# Patient Record
Sex: Male | Born: 1975 | Race: White | Hispanic: No | Marital: Married | Smoking: Heavy tobacco smoker
Health system: Southern US, Community
[De-identification: ages and names within clinical notes are randomized; demographics above are authoritative.]

## PROBLEM LIST (undated history)

## (undated) DIAGNOSIS — G4730 Sleep apnea, unspecified: Secondary | ICD-10-CM

## (undated) DIAGNOSIS — K219 Gastro-esophageal reflux disease without esophagitis: Secondary | ICD-10-CM

## (undated) DIAGNOSIS — T7840XA Allergy, unspecified, initial encounter: Secondary | ICD-10-CM

## (undated) HISTORY — DX: Gastro-esophageal reflux disease without esophagitis: K21.9

## (undated) HISTORY — PX: APPENDECTOMY: SHX54

## (undated) HISTORY — DX: Sleep apnea, unspecified: G47.30

## (undated) HISTORY — DX: Allergy, unspecified, initial encounter: T78.40XA

## (undated) HISTORY — PX: TONSILLECTOMY: SHX5217

---

## 2004-10-05 ENCOUNTER — Ambulatory Visit: Payer: Self-pay | Admitting: Unknown Physician Specialty

## 2015-03-24 ENCOUNTER — Ambulatory Visit
Admission: RE | Admit: 2015-03-24 | Discharge: 2015-03-24 | Disposition: A | Discharge status: Home / Self Care | Payer: No Typology Code available for payment source | Source: Ambulatory Visit | Attending: Family Medicine | Admitting: Family Medicine

## 2015-03-24 ENCOUNTER — Encounter: Payer: Self-pay | Admitting: Family Medicine

## 2015-03-24 ENCOUNTER — Ambulatory Visit (INDEPENDENT_AMBULATORY_CARE_PROVIDER_SITE_OTHER): Payer: PRIVATE HEALTH INSURANCE | Admitting: Family Medicine

## 2015-03-24 VITALS — BP 121/73 | HR 85 | Temp 98.1°F | Resp 19 | Wt 306.5 lb

## 2015-03-24 DIAGNOSIS — M79672 Pain in left foot: Secondary | ICD-10-CM

## 2015-03-24 MED ORDER — NAPROXEN 500 MG PO TABS: 500.0000 mg | ORAL_TABLET | Freq: Two times a day (BID) | ORAL | Status: DC

## 2015-03-24 NOTE — Progress Notes (Signed)
Name: Devin Blackburn   MRN: [id number]    DOB: Jan 07, 1976   Date:03/24/2015       Progress Note  Subjective  Chief Complaint  Chief Complaint  Patient presents with  . Establish Care    NP/ Bad pain in left foot x1 week / depression    HPI   Pt. Present with 1 week history of left foot pain. Pain is present in the back side of heel and sometimes goes up to the leg. Pain is a dull ache but patient describes soreness to touch. Walking makes it worse.He has been taking Ibuprofen which provides moderate relief. No history of trauma to the foot.  Past Medical History  Diagnosis Date  . Allergy   . Anxiety   . Depression   . Sleep apnea     Past Surgical History  Procedure Laterality Date  . Appendectomy      childhood  . Tonsillectomy      childhood    History reviewed. No pertinent family history.  Social History   Social History  . Marital Status: Single    Spouse Name: N/A  . Number of Children: N/A  . Years of Education: N/A   Occupational History  . Not on file.   Social History Main Topics  . Smoking status: Light Tobacco Smoker    Types: Cigarettes  . Smokeless tobacco: Current User    Types: Snuff  . Alcohol Use: 0.0 oz/week    0 Standard drinks or equivalent per week  . Drug Use: No  . Sexual Activity: Yes   Other Topics Concern  . Not on file   Social History Narrative  . No narrative on file     Current outpatient prescriptions:  .  ibuprofen (ADVIL,MOTRIN) 200 MG tablet, Take 250 mg by mouth every 6 (six) hours as needed., Disp: , Rfl:   No Known Allergies   Review of Systems  Musculoskeletal: Positive for joint pain.    Objective  Filed Vitals:   03/24/15 1127  BP: 121/73  Pulse: 85  Temp: 98.1 F (36.7 C)  TempSrc: Oral  Resp: 19  Weight: 306 lb 8 oz (139.027 kg)  SpO2: 96%    Physical Exam  Musculoskeletal:       Left foot: There is tenderness. There is no swelling.       Feet:  Nursing note and vitals  reviewed.   Assessment & Plan  1. Acute pain of left foot Obtain x-rays of foot and started patient on Naprosyn relief of pain and inflammation. Follow-up in  1 week for repeat assessment.if x-rays are unremarkable and his pain persists, he will be referred to podiatry.  - DG Foot Complete Left; Future - naproxen (NAPROSYN) 500 MG tablet; Take 1 tablet (500 mg total) by mouth 2 (two) times daily with a meal.  Dispense: 15 tablet; Refill: 0   Khiara Shuping Asad [facility] 03/24/2015 11:57 AM

## 2015-03-31 ENCOUNTER — Ambulatory Visit: Payer: PRIVATE HEALTH INSURANCE | Admitting: Family Medicine

## 2015-05-17 ENCOUNTER — Ambulatory Visit: Payer: PRIVATE HEALTH INSURANCE | Admitting: Family Medicine

## 2015-05-30 ENCOUNTER — Ambulatory Visit: Payer: PRIVATE HEALTH INSURANCE | Admitting: Family Medicine

## 2015-06-09 ENCOUNTER — Ambulatory Visit (INDEPENDENT_AMBULATORY_CARE_PROVIDER_SITE_OTHER): Payer: 59 | Admitting: Family Medicine

## 2015-06-09 ENCOUNTER — Encounter: Payer: Self-pay | Admitting: Family Medicine

## 2015-06-09 VITALS — BP 120/70 | HR 80 | Temp 97.6°F | Resp 18 | Ht 75.0 in | Wt 299.1 lb

## 2015-06-09 DIAGNOSIS — F419 Anxiety disorder, unspecified: Secondary | ICD-10-CM

## 2015-06-09 DIAGNOSIS — K219 Gastro-esophageal reflux disease without esophagitis: Secondary | ICD-10-CM | POA: Diagnosis not present

## 2015-06-09 DIAGNOSIS — IMO0001 Reserved for inherently not codable concepts without codable children: Secondary | ICD-10-CM

## 2015-06-09 DIAGNOSIS — E669 Obesity, unspecified: Secondary | ICD-10-CM | POA: Insufficient documentation

## 2015-06-09 MED ORDER — ALPRAZOLAM 0.25 MG PO TABS: 0.2500 mg | ORAL_TABLET | Freq: Three times a day (TID) | ORAL | Status: DC | PRN
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
## 2015-06-10 LAB — COMPREHENSIVE METABOLIC PANEL
A/G RATIO: 2.5 (ref 1.1–2.5)
ALT: 81 IU/L — AB (ref 0–44)
AST: 66 IU/L — ABNORMAL HIGH (ref 0–40)
Albumin: 4.7 g/dL (ref 3.5–5.5)
Alkaline Phosphatase: 71 IU/L (ref 39–117)
BILIRUBIN TOTAL: 0.6 mg/dL (ref 0.0–1.2)
BUN / CREAT RATIO: 16 (ref 8–19)
BUN: 17 mg/dL (ref 6–20)
CHLORIDE: 98 mmol/L (ref 97–106)
CO2: 26 mmol/L (ref 18–29)
Calcium: 9.9 mg/dL (ref 8.7–10.2)
Creatinine, Ser: 1.09 mg/dL (ref 0.76–1.27)
GFR calc non Af Amer: 86 mL/min/{1.73_m2} (ref >59)
GFR, EST AFRICAN AMERICAN: 99 mL/min/{1.73_m2} (ref >59)
GLOBULIN, TOTAL: 1.9 g/dL (ref 1.5–4.5)
Glucose: 99 mg/dL (ref 65–99)
POTASSIUM: 5 mmol/L (ref 3.5–5.2)
SODIUM: 139 mmol/L (ref 136–144)
TOTAL PROTEIN: 6.6 g/dL (ref 6.0–8.5)

## 2015-06-10 LAB — TSH: TSH: 2.18 u[IU]/mL (ref 0.450–4.500)

## 2015-06-10 LAB — HEMOGLOBIN A1C
ESTIMATED AVERAGE GLUCOSE: 108 mg/dL
Hgb A1c MFr Bld: 5.4 % (ref 4.8–5.6)

## 2015-06-20 ENCOUNTER — Encounter: Payer: Self-pay | Admitting: Family Medicine

## 2015-06-20 ENCOUNTER — Ambulatory Visit (INDEPENDENT_AMBULATORY_CARE_PROVIDER_SITE_OTHER): Payer: 59 | Admitting: Family Medicine

## 2015-06-20 VITALS — BP 120/80 | HR 80 | Temp 98.3°F | Resp 16 | Ht 75.0 in | Wt 299.0 lb

## 2015-06-20 DIAGNOSIS — R740 Nonspecific elevation of levels of transaminase and lactic acid dehydrogenase [LDH]: Secondary | ICD-10-CM

## 2015-06-20 DIAGNOSIS — R7401 Elevation of levels of liver transaminase levels: Secondary | ICD-10-CM

## 2015-06-20 DIAGNOSIS — IMO0002 Reserved for concepts with insufficient information to code with codable children: Secondary | ICD-10-CM

## 2015-06-20 DIAGNOSIS — F1099 Alcohol use, unspecified with unspecified alcohol-induced disorder: Secondary | ICD-10-CM

## 2015-06-20 DIAGNOSIS — F419 Anxiety disorder, unspecified: Secondary | ICD-10-CM | POA: Diagnosis not present

## 2015-06-20 MED ORDER — ALPRAZOLAM 0.5 MG PO TABS: 0.5000 mg | ORAL_TABLET | Freq: Three times a day (TID) | ORAL | Status: DC | PRN

## 2015-06-20 NOTE — Progress Notes (Signed)
Name: Devin Blackburn   MRN: [id number]    DOB: 10/23/1975   Date:06/20/2015       Progress Note  Subjective  Chief Complaint  Chief Complaint  Patient presents with  . Follow-up    elevated liver enzymes  . Anxiety  . Gastroesophageal Reflux    Anxiety Presents for follow-up visit. Symptoms include excessive worry, insomnia, malaise and nervous/anxious behavior. Patient reports no depressed mood or nausea. The severity of symptoms is moderate and causing significant distress (Pt. drinks alcohol to cope with anxiety and depression.). The symptoms are aggravated by family issues.   Risk factors include a major life event. The treatment provided mild relief. Compliance with prior treatments has been good.    Pt. Is here for follow up of elevated liver enzymes. AST and ALT were obtained 10-days ago and were 66 and 81 respectively. Pt. Has no known history of liver disease. He does admit to drinking heavily (3 18 oz cans every weeknight) and similarly on the weekends.  He has since decreased his consumption marginally (last Sunday, he 'might have' had 12  12 oz cans of beer from 1 PM to 9PM).   Past Medical History  Diagnosis Date  . Allergy   . Sleep apnea   . Acid reflux     Sometimes takes Omeprazole.    Past Surgical History  Procedure Laterality Date  . Appendectomy      childhood  . Tonsillectomy      childhood    Family History  Problem Relation Age of Onset  . Depression Mother   . Thyroid disease Mother   . Diabetes Mother     Social History   Social History  . Marital Status: Single    Spouse Name: N/A  . Number of Children: N/A  . Years of Education: N/A   Occupational History  . Not on file.   Social History Main Topics  . Smoking status: Light Tobacco Smoker    Types: Cigarettes  . Smokeless tobacco: Current User    Types: Chew  . Alcohol Use: 0.0 oz/week    0 Standard drinks or equivalent per week  . Drug Use: No  . Sexual Activity: Yes    Other Topics Concern  . Not on file   Social History Narrative    Current outpatient prescriptions:  .  ALPRAZolam (XANAX) 0.25 MG tablet, Take 1 tablet (0.25 mg total) by mouth 3 (three) times daily as needed for anxiety., Disp: 90 tablet, Rfl: 0 .  ibuprofen (ADVIL,MOTRIN) 200 MG tablet, Take 250 mg by mouth every 6 (six) hours as needed., Disp: , Rfl:  .  naproxen (NAPROSYN) 500 MG tablet, Take 1 tablet (500 mg total) by mouth 2 (two) times daily with a meal. (Patient not taking: Reported on 06/09/2015), Disp: 15 tablet, Rfl: 0  No Known Allergies   Review of Systems  Gastrointestinal: Negative for heartburn, nausea, vomiting, abdominal pain, diarrhea, blood in stool and melena.  Genitourinary: Negative for urgency, frequency and hematuria.  Psychiatric/Behavioral: Positive for depression. The patient is nervous/anxious and has insomnia.     Objective  Filed Vitals:   06/20/15 0911  BP: 120/80  Pulse: 80  Temp: 98.3 F (36.8 C)  TempSrc: Oral  Resp: 16  Height: 6\' 3"  (1.905 m)  Weight: 299 lb (135.626 kg)  SpO2: 96%    Physical Exam  Constitutional: He is oriented to person, place, and time and well-developed, well-nourished, and in no distress.  HENT:  Head:  Normocephalic and atraumatic.  Cardiovascular: Normal rate, regular rhythm and normal heart sounds.   No murmur heard. Pulmonary/Chest: Effort normal and breath sounds normal. He has no wheezes.  Abdominal: Soft. Normal appearance and bowel sounds are normal. There is no hepatomegaly. There is no tenderness. There is no rebound.  Neurological: He is alert and oriented to person, place, and time.  Psychiatric: Memory, affect and judgment normal. His mood appears anxious.  Emotional while describing his alcohol use.  Nursing note and vitals reviewed.  Recent Results (from the past 2160 hour(s))  Comprehensive Metabolic Panel (CMET)     Status: Abnormal   Collection Time: 06/09/15 11:12 AM  Result Value Ref  Range   Glucose 99 65 - 99 mg/dL   BUN 17 6 - 20 mg/dL   Creatinine, Ser 5.28 0.76 - 1.27 mg/dL   GFR calc non Af Amer 86 >59 mL/min/1.73   GFR calc Af Amer 99 >59 mL/min/1.73   BUN/Creatinine Ratio 16 8 - 19   Sodium 139 136 - 144 mmol/L   Potassium 5.0 3.5 - 5.2 mmol/L   Chloride 98 97 - 106 mmol/L   CO2 26 18 - 29 mmol/L   Calcium 9.9 8.7 - 10.2 mg/dL   Total Protein 6.6 6.0 - 8.5 g/dL   Albumin 4.7 3.5 - 5.5 g/dL   Globulin, Total 1.9 1.5 - 4.5 g/dL   Albumin/Globulin Ratio 2.5 1.1 - 2.5   Bilirubin Total 0.6 0.0 - 1.2 mg/dL   Alkaline Phosphatase 71 39 - 117 IU/L   AST 66 (H) 0 - 40 IU/L   ALT 81 (H) 0 - 44 IU/L  TSH     Status: None   Collection Time: 06/09/15 11:12 AM  Result Value Ref Range   TSH 2.180 0.450 - 4.500 uIU/mL  HgB A1c     Status: None   Collection Time: 06/09/15 11:12 AM  Result Value Ref Range   Hgb A1c MFr Bld 5.4 4.8 - 5.6 %    Comment:          Pre-diabetes: 5.7 - 6.4          Diabetes: >6.4          Glycemic control for adults with diabetes: <7.0    Est. average glucose Bld gHb Est-mCnc 108 mg/dL     Assessment & Plan  1. Anxiety Patient experienced only marginal improvement with alprazolam 0.25 mg, we'll change to 0.5 mg 3 times a day when necessary for relief of anxiety. Again educated on the dependence potential. Patient may need evaluation by psychiatry for symptoms suggestive of PTSD but is not interested in any further pharmacotherapy at this time. Refills provided and follow-up in one month. - ALPRAZolam (XANAX) 0.5 MG tablet; Take 1 tablet (0.5 mg total) by mouth 3 (three) times daily as needed for anxiety.  Dispense: 90 tablet; Refill: 0  2. Transaminitis Likely 2/2 alcohol abuse but we'll obtain lab work to rule out hepatitis and other etiologies. - Basic Metabolic Panel (BMET) - Hepatic function panel - Gamma GT - US Abdomen Limited RUQ; Future - CBC w/Diff/Platelet - Hepatitis, Acute  3. Alcohol use disorder (HCC) Discussed in  detail regarding patient's alcohol use. Recommended to seek counseling with AA but patient has declined at this time. Educated guarding the dangers of alcohol use. Has reduced consumption somewhat. Follow-up at his next office visit appointment.   Devin Blackburn Asad [facility] 06/20/2015 9:21 AM

## 2015-06-21 LAB — CBC WITH DIFFERENTIAL/PLATELET
BASOS ABS: 0 10*3/uL (ref 0.0–0.2)
Basos: 0 %
EOS (ABSOLUTE): 0 10*3/uL (ref 0.0–0.4)
Eos: 1 %
Hematocrit: 43.5 % (ref 37.5–51.0)
Hemoglobin: 14.9 g/dL (ref 12.6–17.7)
Immature Grans (Abs): 0 10*3/uL (ref 0.0–0.1)
Immature Granulocytes: 0 %
LYMPHS ABS: 1.3 10*3/uL (ref 0.7–3.1)
Lymphs: 23 %
MCH: 30.2 pg (ref 26.6–33.0)
MCHC: 34.3 g/dL (ref 31.5–35.7)
MCV: 88 fL (ref 79–97)
MONOCYTES: 10 %
MONOS ABS: 0.6 10*3/uL (ref 0.1–0.9)
Neutrophils Absolute: 3.8 10*3/uL (ref 1.4–7.0)
Neutrophils: 66 %
Platelets: 249 10*3/uL (ref 150–379)
RBC: 4.94 x10E6/uL (ref 4.14–5.80)
RDW: 12.7 % (ref 12.3–15.4)
WBC: 5.7 10*3/uL (ref 3.4–10.8)

## 2015-06-21 LAB — BASIC METABOLIC PANEL
BUN / CREAT RATIO: 13 (ref 8–19)
BUN: 13 mg/dL (ref 6–20)
CHLORIDE: 100 mmol/L (ref 97–106)
CO2: 25 mmol/L (ref 18–29)
Calcium: 9.8 mg/dL (ref 8.7–10.2)
Creatinine, Ser: 1.02 mg/dL (ref 0.76–1.27)
GFR, EST AFRICAN AMERICAN: 107 mL/min/{1.73_m2} (ref >59)
GFR, EST NON AFRICAN AMERICAN: 93 mL/min/{1.73_m2} (ref >59)
Glucose: 100 mg/dL — ABNORMAL HIGH (ref 65–99)
Potassium: 4.7 mmol/L (ref 3.5–5.2)
Sodium: 142 mmol/L (ref 136–144)

## 2015-06-21 LAB — HEPATIC FUNCTION PANEL
ALK PHOS: 59 IU/L (ref 39–117)
ALT: 36 IU/L (ref 0–44)
AST: 25 IU/L (ref 0–40)
Albumin: 4.3 g/dL (ref 3.5–5.5)
BILIRUBIN, DIRECT: 0.21 mg/dL (ref 0.00–0.40)
Bilirubin Total: 0.8 mg/dL (ref 0.0–1.2)
Total Protein: 6.6 g/dL (ref 6.0–8.5)

## 2015-06-21 LAB — HEPATITIS PANEL, ACUTE
HEP A IGM: NEGATIVE
HEP B S AG: NEGATIVE
Hep B C IgM: NEGATIVE
Hep C Virus Ab: <0.1 s/co ratio (ref 0.0–0.9)

## 2015-06-21 LAB — GAMMA GT: GGT: 17 IU/L (ref 0–65)

## 2015-06-23 ENCOUNTER — Ambulatory Visit: Payer: 59 | Admitting: Family Medicine

## 2015-06-26 ENCOUNTER — Ambulatory Visit: Payer: No Typology Code available for payment source

## 2015-06-30 ENCOUNTER — Ambulatory Visit: Payer: 59

## 2015-07-14 ENCOUNTER — Ambulatory Visit: Payer: 59 | Admitting: Family Medicine

## 2015-07-22 ENCOUNTER — Other Ambulatory Visit: Payer: Self-pay | Admitting: Family Medicine

## 2015-10-06 ENCOUNTER — Ambulatory Visit (INDEPENDENT_AMBULATORY_CARE_PROVIDER_SITE_OTHER): Payer: 59 | Admitting: Family Medicine

## 2015-10-06 ENCOUNTER — Encounter: Payer: Self-pay | Admitting: Family Medicine

## 2015-10-06 VITALS — BP 118/76 | HR 74 | Temp 97.5°F | Resp 17 | Ht 75.0 in | Wt 304.3 lb

## 2015-10-06 DIAGNOSIS — F419 Anxiety disorder, unspecified: Secondary | ICD-10-CM

## 2015-10-06 DIAGNOSIS — F4310 Post-traumatic stress disorder, unspecified: Secondary | ICD-10-CM | POA: Diagnosis not present

## 2015-10-06 MED ORDER — ALPRAZOLAM 0.5 MG PO TABS: 0.5000 mg | ORAL_TABLET | Freq: Three times a day (TID) | ORAL | Status: DC | PRN

## 2015-10-06 MED ORDER — SERTRALINE HCL 50 MG PO TABS: 50.0000 mg | ORAL_TABLET | Freq: Every day | ORAL | Status: DC

## 2015-10-06 MED ORDER — SERTRALINE HCL 25 MG PO TABS: 25.0000 mg | ORAL_TABLET | Freq: Every day | ORAL | Status: DC
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
## 2015-11-03 ENCOUNTER — Other Ambulatory Visit: Payer: Self-pay | Admitting: Family Medicine

## 2015-11-17 ENCOUNTER — Ambulatory Visit: Payer: 59 | Admitting: Family Medicine

## 2015-11-24 ENCOUNTER — Ambulatory Visit: Payer: 59 | Admitting: Family Medicine

## 2015-12-01 ENCOUNTER — Encounter: Payer: Self-pay | Admitting: Family Medicine

## 2015-12-01 ENCOUNTER — Ambulatory Visit (INDEPENDENT_AMBULATORY_CARE_PROVIDER_SITE_OTHER): Payer: 59 | Admitting: Family Medicine

## 2015-12-01 ENCOUNTER — Ambulatory Visit: Payer: 59 | Admitting: Family Medicine

## 2015-12-01 VITALS — BP 122/78 | HR 82 | Temp 98.7°F | Resp 17 | Ht 75.0 in | Wt 286.4 lb

## 2015-12-01 DIAGNOSIS — F419 Anxiety disorder, unspecified: Secondary | ICD-10-CM | POA: Diagnosis not present

## 2015-12-01 DIAGNOSIS — M25571 Pain in right ankle and joints of right foot: Secondary | ICD-10-CM

## 2015-12-01 DIAGNOSIS — F4310 Post-traumatic stress disorder, unspecified: Secondary | ICD-10-CM | POA: Diagnosis not present

## 2015-12-01 DIAGNOSIS — F988 Other specified behavioral and emotional disorders with onset usually occurring in childhood and adolescence: Secondary | ICD-10-CM | POA: Insufficient documentation

## 2015-12-01 DIAGNOSIS — Z1389 Encounter for screening for other disorder: Secondary | ICD-10-CM

## 2015-12-01 DIAGNOSIS — Z1339 Encounter for screening examination for other mental health and behavioral disorders: Secondary | ICD-10-CM

## 2015-12-01 MED ORDER — ALPRAZOLAM 0.5 MG PO TABS: 0.5000 mg | ORAL_TABLET | Freq: Three times a day (TID) | ORAL | Status: DC | PRN

## 2015-12-01 MED ORDER — SERTRALINE HCL 50 MG PO TABS: 50.0000 mg | ORAL_TABLET | Freq: Every day | ORAL | Status: DC

## 2015-12-01 NOTE — Progress Notes (Signed)
Name: Devin Blackburn   MRN: [id number]    DOB: [birth year]   Date:12/01/2015       Progress Note  Subjective  Chief Complaint  Chief Complaint  Patient presents with  . Follow-up    6 wk  . Medication Refill    xanax / sertraline     Ankle Pain  Injury mechanism: History of right ankle fracture many years ago, no recent  trauma. The pain is present in the right ankle. The quality of the pain is described as aching. The pain is at a severity of 5/10. The pain is moderate. The pain has been constant (since he has been rolling his ankle, has constant pain.) since onset. Pertinent negatives include no inability to bear weight or loss of motion. The symptoms are aggravated by movement and weight bearing (rolling over makes it worse). He has tried ice, NSAIDs and rest for the symptoms.  Anxiety Presents for follow-up visit. The problem has been gradually improving. Symptoms include excessive worry and nervous/anxious behavior. Patient reports no depressed mood, insomnia or panic. Symptoms occur most days. The severity of symptoms is causing significant distress and moderate.   His past medical history is significant for anxiety/panic attacks. Past treatments include benzodiazephines and SSRIs. The treatment provided moderate relief. Compliance with prior treatments has been good.   Pt. Is convinced that he has Attention Deficit Disorder, he has trouble focusing (as in focusing during a conversation, not being able to finish reading a book). He has never been formally diagnosed or tested as an adult but believes that had Attention Deficit Hyperactivity Disorder as a child as well which was not treated.   Past Medical History  Diagnosis Date  . Allergy   . Sleep apnea   . Acid reflux     Sometimes takes Omeprazole.    Past Surgical History  Procedure Laterality Date  . Appendectomy      childhood  . Tonsillectomy      childhood    Family History  Problem Relation Age of Onset  .  Depression Mother   . Thyroid disease Mother   . Diabetes Mother     Social History   Social History  . Marital Status: Single    Spouse Name: N/A  . Number of Children: N/A  . Years of Education: N/A   Occupational History  . Not on file.   Social History Main Topics  . Smoking status: Light Tobacco Smoker    Types: Cigarettes  . Smokeless tobacco: Current User    Types: Chew  . Alcohol Use: 0.0 oz/week    0 Standard drinks or equivalent per week  . Drug Use: No  . Sexual Activity: Yes   Other Topics Concern  . Not on file   Social History Narrative     Current outpatient prescriptions:  .  ALPRAZolam (XANAX) 0.5 MG tablet, Take 1 tablet (0.5 mg total) by mouth 3 (three) times daily as needed for anxiety., Disp: 90 tablet, Rfl: 0 .  ibuprofen (ADVIL,MOTRIN) 200 MG tablet, Take 250 mg by mouth every 6 (six) hours as needed. Reported on 10/06/2015, Disp: , Rfl:  .  sertraline (ZOLOFT) 50 MG tablet, TAKE 1 TABLET EVERY DAY, Disp: 90 tablet, Rfl: 0  No Known Allergies   Review of Systems  Constitutional: Negative for fever and chills.  Musculoskeletal: Positive for joint pain.  Psychiatric/Behavioral: Positive for depression (post-traumatic stress reaction.). The patient is nervous/anxious. The patient does not have insomnia.  Objective  Filed Vitals:   12/01/15 0932  BP: 122/78  Pulse: 82  Temp: 98.7 F (37.1 C)  TempSrc: Oral  Resp: 17  Height: 6\' 3"  (1.905 m)  Weight: 286 lb 6.4 oz (129.91 kg)  SpO2: 96%    Physical Exam  Constitutional: He is well-developed, well-nourished, and in no distress.  Musculoskeletal:       Right ankle: He exhibits no swelling. Tenderness. Medial malleolus (around the inferior margin of medial malleolus.) tenderness found.       Feet:  Nursing note and vitals reviewed.     Assessment & Plan  1. Pain in right ankle Persistent activity limiting pain in right ankle, referral to orthopedic surgery. - Ambulatory  referral to Orthopedic Surgery  2. Anxiety Stable and responsive to alprazolam taken 3 times daily as needed. Refills provided. - ALPRAZolam (XANAX) 0.5 MG tablet; Take 1 tablet (0.5 mg total) by mouth 3 (three) times daily as needed for anxiety.  Dispense: 90 tablet; Refill: 0  3. Post-traumatic stress reaction Continue on sertraline to relieve symptoms of posttraumatic stress reaction - sertraline (ZOLOFT) 50 MG tablet; Take 1 tablet (50 mg total) by mouth daily.  Dispense: 90 tablet; Refill: 0  4. Attention deficit hyperactivity disorder evaluation Adult ADHD questionnaire provided to patient, which will be reviewed at his follow-up visit in one month.   Marchia Diguglielmo Asad A. [facility] 12/01/2015 9:55 AM

## 2015-12-29 ENCOUNTER — Encounter: Payer: Self-pay | Admitting: Family Medicine

## 2015-12-29 ENCOUNTER — Ambulatory Visit (INDEPENDENT_AMBULATORY_CARE_PROVIDER_SITE_OTHER): Payer: 59 | Admitting: Family Medicine

## 2015-12-29 VITALS — BP 120/72 | HR 73 | Temp 98.3°F | Resp 16 | Ht 75.0 in | Wt 290.8 lb

## 2015-12-29 DIAGNOSIS — F4310 Post-traumatic stress disorder, unspecified: Secondary | ICD-10-CM

## 2015-12-29 DIAGNOSIS — F419 Anxiety disorder, unspecified: Secondary | ICD-10-CM | POA: Diagnosis not present

## 2015-12-29 DIAGNOSIS — F900 Attention-deficit hyperactivity disorder, predominantly inattentive type: Secondary | ICD-10-CM

## 2015-12-29 DIAGNOSIS — F988 Other specified behavioral and emotional disorders with onset usually occurring in childhood and adolescence: Secondary | ICD-10-CM

## 2015-12-29 MED ORDER — AMPHETAMINE-DEXTROAMPHET ER 20 MG PO CP24
20.0000 mg | ORAL_CAPSULE | ORAL | Status: DC
Start: 2015-12-29 — End: 2016-01-24

## 2015-12-29 MED ORDER — ALPRAZOLAM 0.5 MG PO TABS: 0.5000 mg | ORAL_TABLET | Freq: Three times a day (TID) | ORAL | Status: DC | PRN

## 2015-12-29 MED ORDER — SERTRALINE HCL 50 MG PO TABS: 50.0000 mg | ORAL_TABLET | Freq: Every day | ORAL | Status: DC

## 2015-12-29 NOTE — Progress Notes (Signed)
Name: Devin Blackburn   MRN: [id number]    DOB: 12/31/1975   Date:12/29/2015       Progress Note  Subjective  Chief Complaint  Chief Complaint  Patient presents with  . Follow-up    1 mo  . Medication Refill    xanax 0.5 mg / sertraline 50 mg     HPI  Anxiety: Presents for refill of Alprazolam and Setraline. Symptoms include nervousness, worrying, takes Sertraline 50 mg daily and Alprazolam 0.5 mg three times daily as needed. Feels like Sertraline lets him takes his mind off worrying for his son's safety.   Attention Deficit Disorder: Pt. Presents for review of ADHD questonaire. Symptoms include trouble focusing, starting and not finishing tasks, difficulty relaxing, avoiding tasks that require a lot of thought.   Past Medical History  Diagnosis Date  . Allergy   . Sleep apnea   . Acid reflux     Sometimes takes Omeprazole.    Past Surgical History  Procedure Laterality Date  . Appendectomy      childhood  . Tonsillectomy      childhood    Family History  Problem Relation Age of Onset  . Depression Mother   . Thyroid disease Mother   . Diabetes Mother     Social History   Social History  . Marital Status: Single    Spouse Name: N/A  . Number of Children: N/A  . Years of Education: N/A   Occupational History  . Not on file.   Social History Main Topics  . Smoking status: Light Tobacco Smoker    Types: Cigarettes  . Smokeless tobacco: Current User    Types: Chew  . Alcohol Use: 0.0 oz/week    0 Standard drinks or equivalent per week  . Drug Use: No  . Sexual Activity: Yes   Other Topics Concern  . Not on file   Social History Narrative     Current outpatient prescriptions:  .  ALPRAZolam (XANAX) 0.5 MG tablet, Take 1 tablet (0.5 mg total) by mouth 3 (three) times daily as needed for anxiety., Disp: 90 tablet, Rfl: 0 .  ibuprofen (ADVIL,MOTRIN) 200 MG tablet, Take 250 mg by mouth every 6 (six) hours as needed. Reported on 10/06/2015, Disp: , Rfl:  .   sertraline (ZOLOFT) 50 MG tablet, Take 1 tablet (50 mg total) by mouth daily., Disp: 90 tablet, Rfl: 0  No Known Allergies   Review of Systems  Constitutional: Negative for fever and chills.  Respiratory: Negative for cough.   Cardiovascular: Negative for chest pain and palpitations.  Gastrointestinal: Negative for abdominal pain.  Psychiatric/Behavioral: Negative for depression. The patient is not nervous/anxious and does not have insomnia.      Objective  Filed Vitals:   12/29/15 1042  BP: 120/72  Pulse: 73  Temp: 98.3 F (36.8 C)  TempSrc: Oral  Resp: 16  Height: 6\' 3"  (1.905 m)  Weight: 290 lb 12.8 oz (131.906 kg)  SpO2: 97%    Physical Exam  Constitutional: He is oriented to person, place, and time and well-developed, well-nourished, and in no distress.  HENT:  Head: Normocephalic and atraumatic.  Cardiovascular: Normal rate and regular rhythm.   Pulmonary/Chest: Effort normal.  Abdominal: Soft. Bowel sounds are normal.  Neurological: He is alert and oriented to person, place, and time.  Psychiatric: Mood, memory, affect and judgment normal.  Nursing note and vitals reviewed.      Assessment & Plan  1. Anxiety Stable, was then taken  occasionally as needed. - ALPRAZolam (XANAX) 0.5 MG tablet; Take 1 tablet (0.5 mg total) by mouth 3 (three) times daily as needed for anxiety.  Dispense: 90 tablet; Refill: 0  2. Post-traumatic stress reaction Feels improved, less worried and nervous on sertraline. Continue present therapy - sertraline (ZOLOFT) 50 MG tablet; Take 1 tablet (50 mg total) by mouth daily.  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 We'll start on Adderall extended release 20 mg daily. Educated on potential adverse effects. Follow-up in one month - amphetamine-dextroamphetamine (ADDERALL XR)  20 MG 24 hr capsule; Take 1 capsule (20 mg total) by mouth every morning.  Dispense: 30 capsule; Refill: 0   Safiyya Stokes [facility]ad [facility] 12/29/2015 10:48 AM

## 2016-01-24 ENCOUNTER — Ambulatory Visit (INDEPENDENT_AMBULATORY_CARE_PROVIDER_SITE_OTHER): Payer: 59 | Admitting: Family Medicine

## 2016-01-24 ENCOUNTER — Encounter: Payer: Self-pay | Admitting: Family Medicine

## 2016-01-24 VITALS — BP 118/71 | HR 96 | Temp 98.6°F | Resp 18 | Ht 75.0 in | Wt 285.6 lb

## 2016-01-24 DIAGNOSIS — F900 Attention-deficit hyperactivity disorder, predominantly inattentive type: Secondary | ICD-10-CM | POA: Diagnosis not present

## 2016-01-24 DIAGNOSIS — F419 Anxiety disorder, unspecified: Secondary | ICD-10-CM | POA: Diagnosis not present

## 2016-01-24 DIAGNOSIS — F988 Other specified behavioral and emotional disorders with onset usually occurring in childhood and adolescence: Secondary | ICD-10-CM

## 2016-01-24 MED ORDER — ALPRAZOLAM 0.5 MG PO TABS: 0.5000 mg | ORAL_TABLET | Freq: Three times a day (TID) | ORAL | Status: DC | PRN

## 2016-01-24 MED ORDER — AMPHETAMINE-DEXTROAMPHET ER 20 MG PO CP24: 20.0000 mg | ORAL_CAPSULE | ORAL | Status: DC

## 2016-01-24 NOTE — Progress Notes (Signed)
Name: Devin Blackburn   MRN: [id number]    DOB: 12-18-1975   Date:01/24/2016       Progress Note  Subjective  Chief Complaint  Chief Complaint  Patient presents with  . Medication Refill    HPI   Anxiety: Presents for refill of Alprazolam. Symptoms include nervousness, worrying, takes Alprazolam 0.5 mg three times daily as needed. She believes that alprazolam does help lower his anxiety and stress level and he does not stay nearly as anxious as he used to.   Attention Deficit Disorder: Pt. Presents for medication follow up and review of Attention Deficit Disorder. Symptoms include trouble focusing, starting and not finishing tasks, difficulty relaxing, avoiding tasks that require a lot of thought. He was started on Adderall last month and feels much better in terms of his focus and attention. Feels more energetic and focused. No medication side effects reported.    Past Medical History  Diagnosis Date  . Allergy   . Sleep apnea   . Acid reflux     Sometimes takes Omeprazole.    Past Surgical History  Procedure Laterality Date  . Appendectomy      childhood  . Tonsillectomy      childhood    Family History  Problem Relation Age of Onset  . Depression Mother   . Thyroid disease Mother   . Diabetes Mother     Social History   Social History  . Marital Status: Single    Spouse Name: N/A  . Number of Children: N/A  . Years of Education: N/A   Occupational History  . Not on file.   Social History Main Topics  . Smoking status: Light Tobacco Smoker    Types: Cigarettes  . Smokeless tobacco: Current User    Types: Chew  . Alcohol Use: 0.0 oz/week    0 Standard drinks or equivalent per week  . Drug Use: No  . Sexual Activity: Yes   Other Topics Concern  . Not on file   Social History Narrative     Current outpatient prescriptions:  .  ALPRAZolam (XANAX) 0.5 MG tablet, Take 1 tablet (0.5 mg total) by mouth 3 (three) times daily as needed for anxiety., Disp:  90 tablet, Rfl: 0 .  amphetamine-dextroamphetamine (ADDERALL XR) 20 MG 24 hr capsule, Take 1 capsule (20 mg total) by mouth every morning., Disp: 30 capsule, Rfl: 0 .  ibuprofen (ADVIL,MOTRIN) 200 MG tablet, Take 250 mg by mouth every 6 (six) hours as needed. Reported on 10/06/2015, Disp: , Rfl:  .  sertraline (ZOLOFT) 50 MG tablet, Take 1 tablet (50 mg total) by mouth daily., Disp: 90 tablet, Rfl: 0  No Known Allergies   Review of Systems  Constitutional: Negative for fever, chills and malaise/fatigue.  Cardiovascular: Negative for chest pain.  Gastrointestinal: Negative for abdominal pain.  Neurological: Negative for headaches.  Psychiatric/Behavioral: Negative for depression. The patient is nervous/anxious. The patient does not have insomnia.       Objective  Filed Vitals:   01/24/16 1437  BP: 118/71  Pulse: 96  Temp: 98.6 F (37 C)  TempSrc: Oral  Resp: 18  Height:  (1.905 m)  Weight: 285 lb 9.6 oz (129.547 kg)  SpO2: 96%    Physical Exam  Constitutional: He is oriented to person, place, and time and well-developed, well-nourished, and in no distress.  Cardiovascular: Normal rate and regular rhythm.   Pulmonary/Chest: Effort normal and breath sounds normal.  Neurological: He is alert and oriented to  person, place, and time.  Psychiatric: Mood, memory, affect and judgment normal.  Nursing note and vitals reviewed.      Assessment & Plan  1. Anxiety Stable and improved on Alprazolam taken up to 3 times daily as needed. Refills provided. - ALPRAZolam (XANAX) 0.5 MG tablet; Take 1 tablet (0.5 mg total) by mouth 3 (three) times daily as needed for anxiety.  Dispense: 90 tablet; Refill: 0  2. Attention deficit disorder (ADD) without hyperactivity Symptoms of attention deficit disorder are improved as noticed by the patient and his soon-to-be spouse. No reported adverse effects. Refills for Adderall provided. - amphetamine-dextroamphetamine (ADDERALL XR) 20 MG 24  hr capsule; Take 1 capsule (20 mg total) by mouth every morning.  Dispense: 30 capsule; Refill: 0   Isiaha Greenup Asad [facility] 01/24/2016 3:08 PM

## 2016-02-22 ENCOUNTER — Telehealth: Payer: Self-pay | Admitting: Family Medicine

## 2016-02-22 DIAGNOSIS — F988 Other specified behavioral and emotional disorders with onset usually occurring in childhood and adolescence: Secondary | ICD-10-CM

## 2016-02-22 MED ORDER — AMPHETAMINE-DEXTROAMPHET ER 20 MG PO CP24: 20.0000 mg | ORAL_CAPSULE | ORAL | Status: DC

## 2016-02-22 NOTE — Telephone Encounter (Signed)
Pt needs refill on Adderell. Pt is down to 5 pills.

## 2016-02-22 NOTE — Telephone Encounter (Signed)
Prescription for Adderall is printed and ready for pickup 

## 2016-02-23 NOTE — Telephone Encounter (Signed)
Pt informed

## 2016-03-03 IMAGING — CR DG FOOT COMPLETE 3+V*L*
1 series · 3 of 3 positions shown · non-contrast
Comparison: None.

CLINICAL DATA: Pain and swelling.

EXAM:
LEFT FOOT - COMPLETE 3+ VIEW

[Series 1: view not recorded · 0.14mm/px · 3 of 3 slices shown]
[im 1/3]
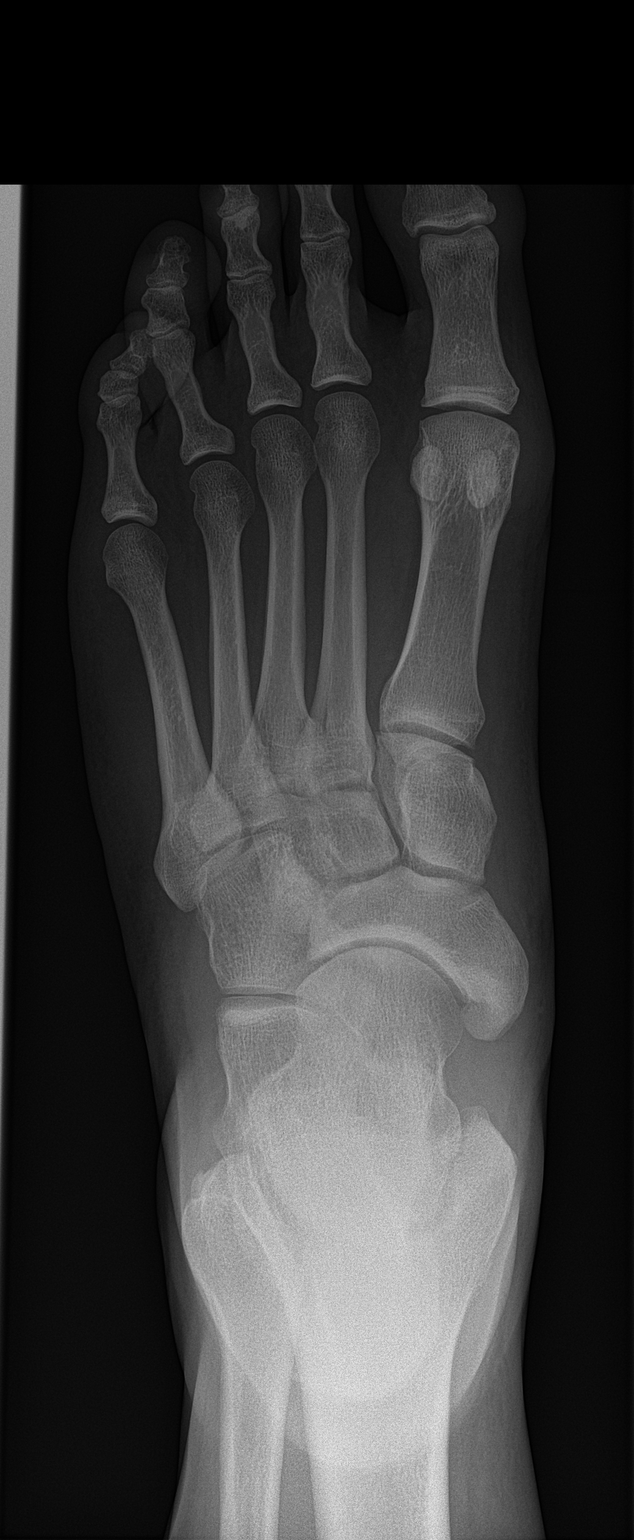
[im 2/3]
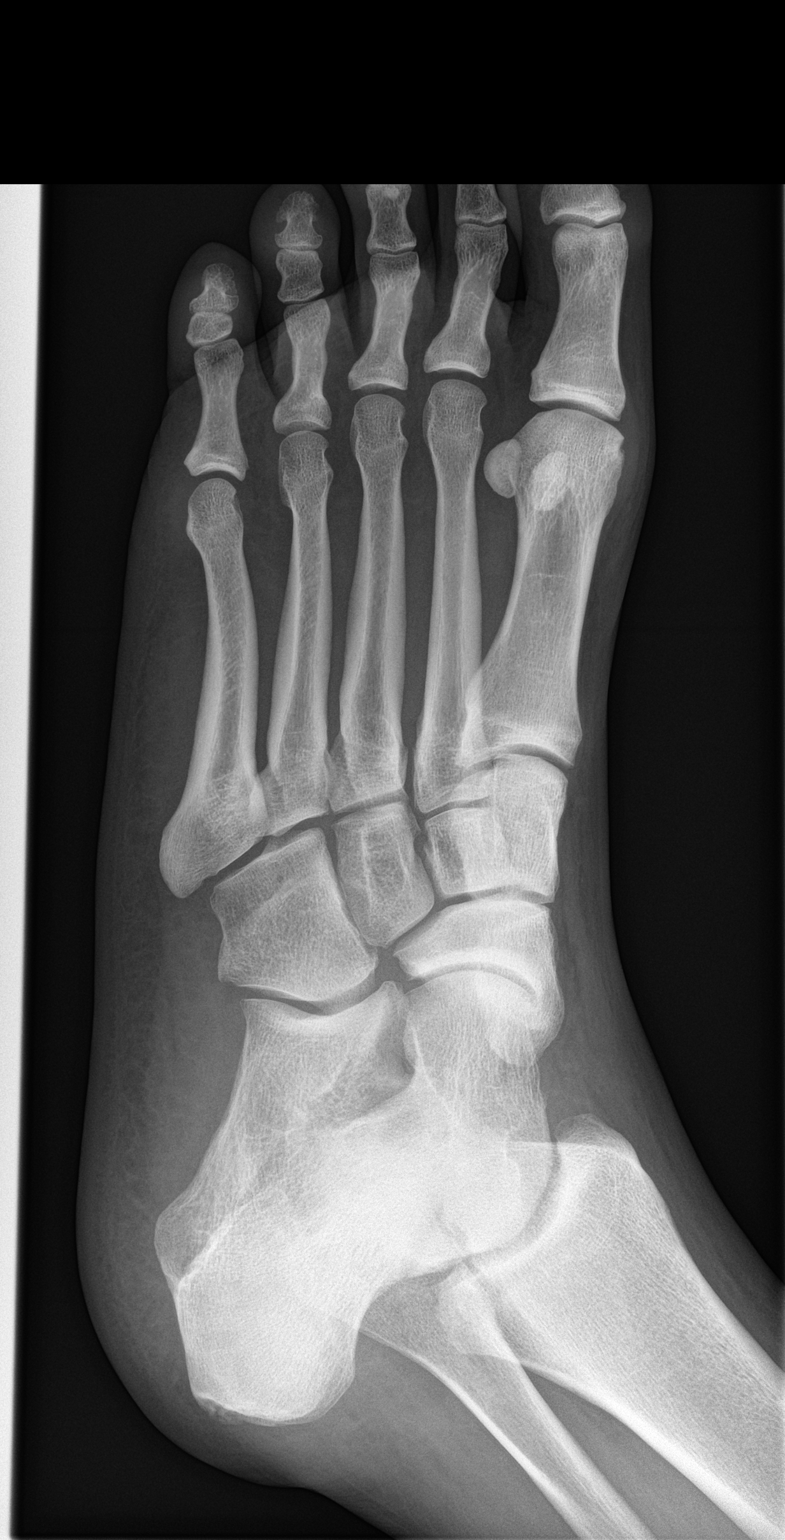
[im 3/3]
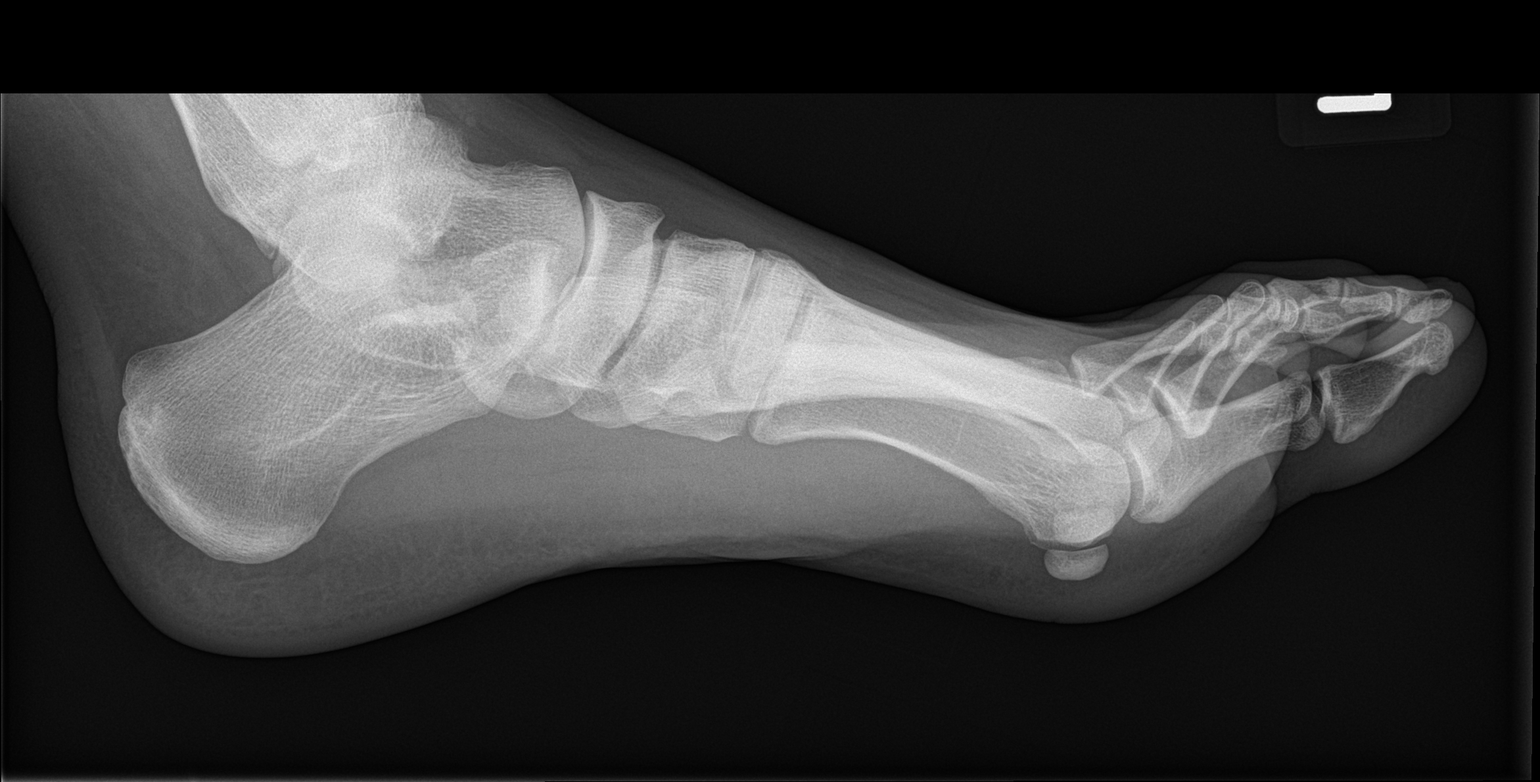

[3 of 3 positions shown; findings below may reference images not displayed]

FINDINGS: No acute bony or joint abnormality identified. No evidence of
fracture or dislocation.
IMPRESSION: No acute abnormality.

## 2016-03-25 ENCOUNTER — Other Ambulatory Visit: Payer: Self-pay | Admitting: Family Medicine

## 2016-03-25 ENCOUNTER — Telehealth: Payer: Self-pay | Admitting: Family Medicine

## 2016-03-25 DIAGNOSIS — F988 Other specified behavioral and emotional disorders with onset usually occurring in childhood and adolescence: Secondary | ICD-10-CM

## 2016-03-25 DIAGNOSIS — F4310 Post-traumatic stress disorder, unspecified: Secondary | ICD-10-CM

## 2016-03-25 MED ORDER — AMPHETAMINE-DEXTROAMPHET ER 20 MG PO CP24: 20.0000 mg | ORAL_CAPSULE | ORAL | 0 refills | Status: DC

## 2016-03-25 NOTE — Telephone Encounter (Signed)
Please advise 

## 2016-03-25 NOTE — Telephone Encounter (Signed)
Prescription for Adderall is printed and ready for pickup

## 2016-03-26 NOTE — Telephone Encounter (Signed)
Gina informed prescription is ready for pickup

## 2016-03-27 ENCOUNTER — Other Ambulatory Visit: Payer: Self-pay | Admitting: Family Medicine

## 2016-03-27 DIAGNOSIS — F419 Anxiety disorder, unspecified: Secondary | ICD-10-CM

## 2016-04-12 ENCOUNTER — Ambulatory Visit: Payer: 59 | Admitting: Family Medicine

## 2016-05-28 ENCOUNTER — Telehealth: Payer: Self-pay | Admitting: Family Medicine

## 2016-05-28 DIAGNOSIS — F988 Other specified behavioral and emotional disorders with onset usually occurring in childhood and adolescence: Secondary | ICD-10-CM

## 2016-05-28 DIAGNOSIS — F419 Anxiety disorder, unspecified: Secondary | ICD-10-CM

## 2016-05-28 MED ORDER — AMPHETAMINE-DEXTROAMPHET ER 20 MG PO CP24: 20.0000 mg | ORAL_CAPSULE | ORAL | 0 refills | Status: DC

## 2016-05-28 MED ORDER — ALPRAZOLAM 0.5 MG PO TABS: ORAL_TABLET | ORAL | 0 refills | Status: DC

## 2016-05-28 NOTE — Telephone Encounter (Signed)
Patient has appointment scheduled for this Friday. He is completely out of Alprazolam (took last pill today) and adderall. He is asking for a refill. I asked patient about coming in today to be seen but he stated that he is not able to due to being out of town.

## 2016-05-28 NOTE — Telephone Encounter (Signed)
Pt verbally informed. He will have his mom to come by to pick up his prescriptions. I informed him to remind his mom to bring a form of ID in when she comes. He verbalized understanding.

## 2016-05-28 NOTE — Telephone Encounter (Signed)
Prescriptions are printed and ready for pickup

## 2016-05-30 ENCOUNTER — Encounter: Payer: Self-pay | Admitting: Family Medicine

## 2016-05-30 ENCOUNTER — Ambulatory Visit (INDEPENDENT_AMBULATORY_CARE_PROVIDER_SITE_OTHER): Payer: 59 | Admitting: Family Medicine

## 2016-05-30 DIAGNOSIS — F4310 Post-traumatic stress disorder, unspecified: Secondary | ICD-10-CM | POA: Diagnosis not present

## 2016-05-30 DIAGNOSIS — F419 Anxiety disorder, unspecified: Secondary | ICD-10-CM | POA: Diagnosis not present

## 2016-05-30 DIAGNOSIS — F988 Other specified behavioral and emotional disorders with onset usually occurring in childhood and adolescence: Secondary | ICD-10-CM | POA: Diagnosis not present

## 2016-05-30 MED ORDER — SERTRALINE HCL 50 MG PO TABS: 50.0000 mg | ORAL_TABLET | Freq: Every day | ORAL | 0 refills | Status: DC

## 2016-05-30 MED ORDER — ALPRAZOLAM 0.5 MG PO TABS: ORAL_TABLET | ORAL | 2 refills | Status: DC

## 2016-05-30 MED ORDER — AMPHETAMINE-DEXTROAMPHET ER 30 MG PO CP24: 30.0000 mg | ORAL_CAPSULE | ORAL | 0 refills | Status: DC
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
## 2016-05-31 ENCOUNTER — Ambulatory Visit: Payer: 59 | Admitting: Family Medicine

## 2016-06-21 ENCOUNTER — Ambulatory Visit: Payer: 59 | Admitting: Family Medicine

## 2016-07-15 ENCOUNTER — Telehealth: Payer: Self-pay | Admitting: Family Medicine

## 2016-07-15 NOTE — Telephone Encounter (Signed)
Patient has had two appointments scheduled and due to you not being in theoffice we had to reschedule both. Patient has rescheduled for 08/02/16 and would like to know if you could please give him a refill on Adderall enough to last until appointment. He can only come on Fridays due to working. Please call wife Almira CoasterGina [phone] when prescription is ready for pick up.

## 2016-08-02 ENCOUNTER — Ambulatory Visit: Payer: 59 | Admitting: Family Medicine

## 2017-02-04 ENCOUNTER — Emergency Department
Admission: EM | Admit: 2017-02-04 | Discharge: 2017-02-05 | Disposition: A | Discharge status: Other Acute Inpatient Hospital | Payer: 59 | Attending: Emergency Medicine | Admitting: Emergency Medicine

## 2017-02-04 DIAGNOSIS — X12XXXA Contact with other hot fluids, initial encounter: Secondary | ICD-10-CM | POA: Diagnosis not present

## 2017-02-04 DIAGNOSIS — X16XXXA Contact with hot heating appliances, radiators and pipes, initial encounter: Secondary | ICD-10-CM | POA: Insufficient documentation

## 2017-02-04 DIAGNOSIS — T2129XA Burn of second degree of other site of trunk, initial encounter: Secondary | ICD-10-CM | POA: Diagnosis not present

## 2017-02-04 DIAGNOSIS — T2020XA Burn of second degree of head, face, and neck, unspecified site, initial encounter: Secondary | ICD-10-CM | POA: Diagnosis not present

## 2017-02-04 DIAGNOSIS — Y998 Other external cause status: Secondary | ICD-10-CM | POA: Insufficient documentation

## 2017-02-04 DIAGNOSIS — F17220 Nicotine dependence, chewing tobacco, uncomplicated: Secondary | ICD-10-CM | POA: Diagnosis not present

## 2017-02-04 DIAGNOSIS — Y929 Unspecified place or not applicable: Secondary | ICD-10-CM | POA: Insufficient documentation

## 2017-02-04 DIAGNOSIS — Z23 Encounter for immunization: Secondary | ICD-10-CM | POA: Insufficient documentation

## 2017-02-04 DIAGNOSIS — Y9389 Activity, other specified: Secondary | ICD-10-CM | POA: Diagnosis not present

## 2017-02-04 DIAGNOSIS — F17210 Nicotine dependence, cigarettes, uncomplicated: Secondary | ICD-10-CM | POA: Diagnosis not present

## 2017-02-04 DIAGNOSIS — T3120 Burns involving 20-29% of body surface with 0% to 9% third degree burns: Secondary | ICD-10-CM

## 2017-02-04 DIAGNOSIS — Z79899 Other long term (current) drug therapy: Secondary | ICD-10-CM | POA: Insufficient documentation

## 2017-02-04 DIAGNOSIS — T2000XA Burn of unspecified degree of head, face, and neck, unspecified site, initial encounter: Secondary | ICD-10-CM | POA: Diagnosis present

## 2017-02-04 MED ORDER — SODIUM CHLORIDE 0.9 % IV SOLN
Freq: Once | INTRAVENOUS | Status: AC
  Administered 2017-02-04: 22:00:00 via INTRAVENOUS

## 2017-02-04 MED ORDER — FENTANYL CITRATE (PF) 100 MCG/2ML IJ SOLN
50.0000 ug | Freq: Once | INTRAMUSCULAR | Status: AC
  Administered 2017-02-04: 50 ug via INTRAVENOUS

## 2017-02-04 MED ORDER — TETANUS-DIPHTH-ACELL PERTUSSIS 5-2.5-18.5 LF-MCG/0.5 IM SUSP
0.5000 mL | Freq: Once | INTRAMUSCULAR | Status: AC
  Administered 2017-02-04: 0.5 mL via INTRAMUSCULAR

## 2017-02-04 MED ORDER — TETANUS-DIPHTH-ACELL PERTUSSIS 5-2.5-18.5 LF-MCG/0.5 IM SUSP
INTRAMUSCULAR | Status: AC
  Administered 2017-02-04: 0.5 mL via INTRAMUSCULAR
  Filled 2017-02-04: qty 0.5

## 2017-02-04 MED ORDER — EYE WASH OPHTH SOLN
2.0000 [drp] | OPHTHALMIC | Status: DC | PRN
  Administered 2017-02-04: 2 [drp] via OPHTHALMIC

## 2017-02-04 MED ORDER — FENTANYL CITRATE (PF) 100 MCG/2ML IJ SOLN
50.0000 ug | Freq: Once | INTRAMUSCULAR | Status: AC
  Administered 2017-02-04: 50 ug via INTRAVENOUS
  Filled 2017-02-04: qty 2

## 2017-02-04 MED ORDER — EYE WASH OPHTH SOLN
OPHTHALMIC | Status: AC
Start: 2017-02-04 — End: 2017-02-04
  Administered 2017-02-04: 2 [drp] via OPHTHALMIC
  Filled 2017-02-04: qty 118
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
## 2017-02-05 MED ORDER — FENTANYL CITRATE (PF) 100 MCG/2ML IJ SOLN
INTRAMUSCULAR | Status: AC
  Administered 2017-02-05: 50 ug via INTRAVENOUS
  Filled 2017-02-05: qty 2

## 2017-02-05 MED ORDER — FENTANYL CITRATE (PF) 100 MCG/2ML IJ SOLN
50.0000 ug | Freq: Once | INTRAMUSCULAR | Status: AC
Start: 2017-02-05 — End: 2017-02-05
  Administered 2017-02-05: 50 ug via INTRAVENOUS

## 2017-02-05 MED ORDER — FENTANYL CITRATE (PF) 100 MCG/2ML IJ SOLN
100.0000 ug | Freq: Once | INTRAMUSCULAR | Status: AC
  Administered 2017-02-05: 100 ug via INTRAVENOUS

## 2017-02-05 MED ORDER — FENTANYL CITRATE (PF) 100 MCG/2ML IJ SOLN
50.0000 ug | Freq: Once | INTRAMUSCULAR | Status: AC
  Administered 2017-02-05: 50 ug via INTRAVENOUS

## 2017-02-05 MED ORDER — FENTANYL CITRATE (PF) 100 MCG/2ML IJ SOLN
INTRAMUSCULAR | Status: AC
  Administered 2017-02-05: 100 ug via INTRAVENOUS
  Filled 2017-02-05: qty 2

## 2017-02-05 NOTE — ED Notes (Addendum)
Gauze changed on pt with NS, right arm/shoulder, chest, abd and right flank.

## 2017-02-05 NOTE — ED Notes (Signed)
Spoke with EDP about pain medicine for transfer, see MAR for follow up ( fentanyl for transfer for this RN to administer)

## 2017-02-05 NOTE — ED Notes (Addendum)
Pt's gauze with NS changed. Applied to right arm/shoulder, right side chest, abd and right flank area.
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
## 2017-02-05 NOTE — ED Notes (Signed)
Gauze changed on pt with NS, same areas as last gauze change.

## 2017-02-05 NOTE — ED Notes (Addendum)
Gauze on pt changed at this time. Gauze reapplied to right arm/shoulder, chest and abd as well as right side flank.

## 2017-02-05 NOTE — ED Provider Notes (Signed)
EMS here to transport [facility]. He remains medically stable for transport. VSS   Sharman CheekStafford, Marlee Trentman, MD 02/05/17 [phone]

## 2017-02-05 NOTE — ED Notes (Signed)
Pt's gauze with NS on them changed one more time prior to transfer.

## 2017-02-05 NOTE — ED Notes (Signed)
Patient/family upset d/t [facility]. Explained that [facility], that Dr. Alphonzo LemmingsMcShane discussed care with burn center physician and has carried out orders recommended thus far. Explained EMTALA to patient, why we can't just send patient over without acceptance, etc. Patient verbalizes understanding.

## 2023-07-17 ENCOUNTER — Ambulatory Visit (INDEPENDENT_AMBULATORY_CARE_PROVIDER_SITE_OTHER): Payer: Self-pay | Admitting: Internal Medicine

## 2023-07-17 VITALS — BP 154/90 | HR 69 | Resp 16 | Ht 68.0 in | Wt 290.0 lb

## 2023-07-17 DIAGNOSIS — G4733 Obstructive sleep apnea (adult) (pediatric): Secondary | ICD-10-CM | POA: Insufficient documentation

## 2023-07-17 DIAGNOSIS — Z7189 Other specified counseling: Secondary | ICD-10-CM

## 2023-07-17 NOTE — Progress Notes (Signed)
Sleep Medicine   Office Visit  Patient Name: Devin Blackburn DOB: 1976/07/09 MRN [id number]    Chief Complaint: possible sleep apnea  Brief History:  Devin Blackburn presents for initial sleep consult on PAP that he bought on his own without DX The pt has a 10 year history of snoring, trouble falling and staying asleep, and brain fog.   Sleep quality is poor due to not feeling rested. This is noted some nights when not on the machine. The patient relates the following symptoms: daytime fatigue are also present. The patient goes to sleep at 10-11pm and wakes up at 6:30 am. Sleep quality is worse when outside home environment.  Patient has noted restlessness of his legs at night.  The patient  relates no unusual behavior during the night.  The patient reports a history of psychiatric problems (ptsd). The Epworth Sleepiness Score is 11 out of 24 .  The patient relates  Cardiovascular risk factors include: none.     ROS  General: (-) fever, (-) chills, (-) night sweat Nose and Sinuses: (-) nasal stuffiness or itchiness, (-) postnasal drip, (-) nosebleeds, (-) sinus trouble. Mouth and Throat: (-) sore throat, (-) hoarseness. Neck: (-) swollen glands, (-) enlarged thyroid, (-) neck pain. Respiratory: - cough, + shortness of breath, - wheezing. Neurologic: - numbness, - tingling. Psychiatric: - anxiety, - depression Sleep behavior: -sleep paralysis -hypnogogic hallucinations -dream enactment      -vivid dreams -cataplexy -night terrors -sleep walking   Current Medication: Outpatient Encounter Medications as of 07/17/2023  Medication Sig   testosterone cypionate (DEPOTESTOSTERONE CYPIONATE) 200 MG/ML injection SMARTSIG:0.5 Milliliter(s) IM Once a Week   [DISCONTINUED] ALPRAZolam (XANAX) 0.5 MG tablet TAKE 1 TABLET THREE TIMES A DAY AS NEEDED FOR ANXIETY   [DISCONTINUED] amphetamine-dextroamphetamine (ADDERALL XR) 30 MG 24 hr capsule Take 1 capsule (30 mg total) by mouth every morning.    [DISCONTINUED] ibuprofen (ADVIL,MOTRIN) 200 MG tablet Take 250 mg by mouth every 6 (six) hours as needed. Reported on 10/06/2015   [DISCONTINUED] sertraline (ZOLOFT) 50 MG tablet Take 1 tablet (50 mg total) by mouth daily.   No facility-administered encounter medications on file as of 07/17/2023.    Surgical History: Past Surgical History:  Procedure Laterality Date   APPENDECTOMY     childhood   TONSILLECTOMY     childhood    Medical History: Past Medical History:  Diagnosis Date   Acid reflux    Sometimes takes Omeprazole.   Allergy    Sleep apnea     Family History: Non contributory to the present illness  Social History: Social History   Socioeconomic History   Marital status: Married    Spouse name: Not on file   Number of children: Not on file   Years of education: Not on file   Highest education level: Not on file  Occupational History   Not on file  Tobacco Use   Smoking status: Some Days    Current packs/day: 1.00    Types: Cigarettes   Smokeless tobacco: Former    Types: Chew  Substance and Sexual Activity   Alcohol use: Yes    Alcohol/week: 3.0 standard drinks of alcohol    Types: 3 Cans of beer per week    Comment: "few times a week"   Drug use: No   Sexual activity: Yes  Other Topics Concern   Not on file  Social History Narrative   Not on file   Social Drivers of Health   Financial Resource Strain:  Not on file  Food Insecurity: Not on file  Transportation Needs: Not on file  Physical Activity: Not on file  Stress: Not on file  Social Connections: Unknown (10/01/2022)   [facility]   Social Network    Social Network: Not on file  Intimate Partner Violence: Unknown (10/01/2022)   Received from [facility]   HITS    Physically Hurt: Not on file    Insult or Talk Down To: Not on file    Threaten Physical Harm: Not on file    Scream or Curse: Not on file    Vital Signs: Blood pressure (!) 154/90, pulse 69, resp. rate  16, height 5\' 8"  (1.727 m), weight 290 lb (131.5 kg), SpO2 98%. Body mass index is 44.09 kg/m.   Examination: General Appearance: The patient is well-developed, well-nourished, and in no distress. Neck Circumference: 47cm Skin: Gross inspection of skin unremarkable. Head: normocephalic, no gross deformities. Eyes: no gross deformities noted. ENT: ears appear grossly normal Neurologic: Alert and oriented. No involuntary movements.    STOP BANG RISK ASSESSMENT S (snore) Have you been told that you snore?     YES   T (tired) Are you often tired, fatigued, or sleepy during the day?   YES  O (obstruction) Do you stop breathing, choke, or gasp during sleep? YES   P (pressure) Do you have or are you being treated for high blood pressure? NO   B (BMI) Is your body index greater than 35 kg/m? YES   A (age) Are you 57 years old or older? NO   N (neck) Do you have a neck circumference greater than 16 inches?   YES   G (gender) Are you a male? YES   TOTAL STOP/BANG "YES" ANSWERS 6                                                               A STOP-Bang score of 2 or less is considered low risk, and a score of 5 or more is high risk for having either moderate or severe OSA. For people who score 3 or 4, doctors may need to perform further assessment to determine how likely they are to have OSA.         EPWORTH SLEEPINESS SCALE:  Scale:  (0)= no chance of dozing; (1)= slight chance of dozing; (2)= moderate chance of dozing; (3)= high chance of dozing  Chance  Situtation    Sitting and reading: 1    Watching TV: 2    Sitting Inactive in public: 0    As a passenger in car: 2      Lying down to rest: 3    Sitting and talking: 0    Sitting quielty after lunch: 3    In a car, stopped in traffic: 0   TOTAL SCORE:   11 out of 24    SLEEP STUDIES:     LABS: No results found for this or any previous visit (from the past 2160 hours).  Radiology: No results  found.  No results found.  No results found.    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 OSA (obstructive sleep apnea) (Primary) PLAN OSA:   Patient evaluation suggests high risk of sleep disordered breathing due to AHI of 5.4 on cpap of 10.6, snoring, gasping, choking, morning headaches, daytime sleepiness.   Suggest: PSG  to assess/treat the patient's sleep disordered breathing. The patient was also counselled on weight loss to optimize sleep health.   2. CPAP use counseling CPAP Counseling: had a lengthy discussion with the patient regarding the importance of PAP therapy in management of the sleep apnea. Patient appears to understand the risk factor reduction and also understands the risks associated with untreated sleep apnea. Patient will try to make a good faith effort to remain compliant with therapy. Also instructed the patient on proper cleaning of the device including the water must be changed daily if possible and use of distilled water is preferred. Patient understands that the machine should be regularly cleaned with appropriate recommended cleaning solutions that do not damage the PAP machine for example given white vinegar and water rinses. Other methods such as ozone treatment may not be as good as these simple methods to achieve cleaning.   3. Morbid obesity (HCC) Obesity Counseling: Had a lengthy discussion regarding patients BMI and weight issues. Patient was instructed on portion  control as well as increased activity. Also discussed caloric restrictions with trying to maintain intake less than 2000 Kcal. Discussions were made in accordance with the 5As of weight management. Simple actions such as not eating late and if able to, taking a walk is suggested.     General Counseling: I have discussed the findings of the evaluation and examination with Ramon Dredge.  I have also discussed any further diagnostic evaluation thatmay be needed or ordered today. Leotha verbalizes understanding of the findings of todays visit. We also reviewed his medications today and discussed drug interactions and side effects including but not limited excessive drowsiness and altered mental states. We also discussed that there is always a risk not just to him but also people around him. he has been encouraged to call the office with any questions or concerns that should arise related to todays visit.  No orders of the defined types were placed in this encounter.       I have personally obtained a history, evaluated the patient, evaluated pertinent data, formulated the assessment and plan and placed orders.  This patient was seen today by Emmaline Kluver, PA-C in collaboration with Dr. Freda Munro.    Yevonne Pax, MD [facility] Diplomate ABMS Pulmonary and Critical Care Medicine Sleep medicine

## 2024-01-28 NOTE — Progress Notes (Signed)
 [facility] [address] [place], [place] [address]  Pulmonary Sleep Medicine   Office Visit Note  Patient Name: Devin Blackburn DOB: 1975/09/04 MRN [id number]    Chief Complaint: Obstructive Sleep Apnea visit  Brief History:  Devin Blackburn is seen today for follow up after setup on APAP @ 4-20cmH20.  The patient has a 2 month history of sleep apnea. Patient is using PAP nightly.  The patient feels rested after sleeping with PAP.  The patient reports benefit from PAP use. Reported sleepiness is  improved and the Epworth Sleepiness Score is 6 out of 24. The patient does not take naps. The patient complains of the following: mask leak and dry mouth. We recommended a mask fit to try a full face and see if it that helps.   The compliance download shows 94% compliance with an average use time of 6 hours 55 minutes. The AHI is 0.5  The patient does not of limb movements disrupting sleep, he does notice this when he first . Patient complains of mask leak.   ROS  General: (-) fever, (-) chills, (-) night sweat Nose and Sinuses: (-) nasal stuffiness or itchiness, (-) postnasal drip, (-) nosebleeds, (-) sinus trouble. Mouth and Throat: (-) sore throat, (-) hoarseness. Neck: (-) swollen glands, (-) enlarged thyroid, (-) neck pain. Respiratory: - cough, - shortness of breath, - wheezing. Neurologic: - numbness, - tingling. Psychiatric: + anxiety, - depression   Current Medication: Outpatient Encounter Medications as of 01/29/2024  Medication Sig   busPIRone (BUSPAR) 5 MG tablet Take 5 mg by mouth 2 (two) times daily.   cetirizine (ZYRTEC) 10 MG tablet Take 10 mg by mouth daily.   fluticasone (FLONASE) 50 MCG/ACT nasal spray Place into both nostrils.   testosterone cypionate (DEPOTESTOSTERONE CYPIONATE) 200 MG/ML injection SMARTSIG:0.5 Milliliter(s) IM Once a Week   No facility-administered encounter medications on file as of 01/29/2024.    Surgical History: Past Surgical History:   Procedure Laterality Date   APPENDECTOMY     childhood   TONSILLECTOMY     childhood    Medical History: Past Medical History:  Diagnosis Date   Acid reflux    Sometimes takes Omeprazole.   Allergy    Sleep apnea     Family History: Non contributory to the present illness  Social History: Social History   Socioeconomic History   Marital status: Married    Spouse name: Not on file   Number of children: Not on file   Years of education: Not on file   Highest education level: Not on file  Occupational History   Not on file  Tobacco Use   Smoking status: Some Days    Current packs/day: 1.00    Types: Cigarettes   Smokeless tobacco: Former    Types: Chew  Substance and Sexual Activity   Alcohol use: Yes    Alcohol/week: 3.0 standard drinks of alcohol    Types: 3 Cans of beer per week    Comment: few times a week   Drug use: No   Sexual activity: Yes  Other Topics Concern   Not on file  Social History Narrative   Not on file   Social Drivers of Health   Financial Resource Strain: Not on file  Food Insecurity: Not on file  Transportation Needs: Not on file  Physical Activity: Not on file  Stress: Not on file  Social Connections: Unknown (10/01/2022)   [facility]   Social Network  Social Network: Not on file  Intimate Partner Violence: Unknown (10/01/2022)   Received [facility]   HITS    Physically Hurt: Not on file    Insult or Talk Down To: Not on file    Threaten Physical Harm: Not on file    Scream or Curse: Not on file    Vital Signs: Blood pressure (!) 147/89, pulse 62, resp. rate 16, height 6' 2 (1.88 m), weight (!) 319 lb 4.8 oz (144.8 kg), SpO2 95%. Body mass index is 41 kg/m.    Examination: General Appearance: The patient is well-developed, well-nourished, and in no distress. Neck Circumference: 48cm Skin: Gross inspection of skin unremarkable. Head: normocephalic, no gross deformities. Eyes: no gross  deformities noted. ENT: ears appear grossly normal Neurologic: Alert and oriented. No involuntary movements.  STOP BANG RISK ASSESSMENT S (snore) Have you been told that you snore?     NO   T (tired) Are you often tired, fatigued, or sleepy during the day?   NO  O (obstruction) Do you stop breathing, choke, or gasp during sleep? YES   P (pressure) Do you have or are you being treated for high blood pressure? NO   B (BMI) Is your body index greater than 35 kg/m? YES   A (age) Are you 96 years old or older? NO   N (neck) Do you have a neck circumference greater than 16 inches?   YES   G (gender) Are you a male? YES   TOTAL STOP/BANG "YES" ANSWERS 4       A STOP-Bang score of 2 or less is considered low risk, and a score of 5 or more is high risk for having either moderate or severe OSA. For people who score 3 or 4, doctors may need to perform further assessment to determine how likely they are to have OSA.         EPWORTH SLEEPINESS SCALE:  Scale:  (0)= no chance of dozing; (1)= slight chance of dozing; (2)= moderate chance of dozing; (3)= high chance of dozing  Chance  Situtation    Sitting and reading: 1    Watching TV: 1    Sitting Inactive in public: 0    As a passenger in car: 1      Lying down to rest: 2    Sitting and talking: 0    Sitting quielty after lunch: 1    In a car, stopped in traffic: 0   TOTAL SCORE:   6 out of 24    SLEEP STUDIES:  HST - 09/29/23 - RDI 72.9,/hr , Supine 84.0/Hr Lowest SP02 77%    CPAP COMPLIANCE DATA:  Date Range: 11/19/23 - 01/27/24  Average Daily Use: 6 hours 55 minutes   Median Use: 7 hours 9 minutes  Compliance for > 4 Hours: 94% days  AHI: 0.5 respiratory events per hour  Days Used: 70/70  Mask Leak: 23.5  95th Percentile Pressure: 10.5cmH20         LABS: No results found for this or any previous visit (from the past 2160 hours).  Radiology: No results found.  No results found.  No results  found.    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 OSA (obstructive sleep apnea) (Primary) The patient does tolerate PAP and reports  benefit from PAP use. The patient was reminded how to clean equipment and advised to replace supplies routinely. The patient was also counselled on weight loss. The compliance is excellent. The AHI is 0.5.   OSA on cpap- controlled. Continue with excellent compliance with pap. CPAP continues to be medically necessary to treat this patient's OSA. F/u one year.    2. CPAP use counseling CPAP Counseling: had a lengthy discussion with the patient regarding the importance of PAP therapy in management of the sleep apnea. Patient appears to understand the risk factor reduction and also understands the risks associated with untreated sleep apnea. Patient will try to make a good faith effort to remain compliant with therapy. Also instructed the patient on proper cleaning of the device including the water must be changed daily if possible and use of distilled water is preferred. Patient understands that the machine should be regularly cleaned with appropriate recommended cleaning solutions that do not damage the PAP machine for example given white vinegar and water rinses. Other methods such as ozone treatment may not be as good as these simple methods to achieve cleaning.   3. Morbid obesity (HCC) Obesity Counseling: Had a lengthy discussion regarding patients BMI and weight  issues. Patient was instructed on portion control as well as increased activity. Also discussed caloric restrictions with trying to maintain intake less than 2000 Kcal. Discussions were made in accordance with the 5As of weight management. Simple actions such as not eating late and if able to, taking a walk is suggested.     General Counseling: I have discussed the findings of the evaluation and examination with [place].  I have also discussed any further diagnostic evaluation thatmay be needed or ordered today. Famous verbalizes understanding of the findings of todays visit. We also reviewed his medications today and discussed drug interactions and side effects including but not limited excessive drowsiness and altered mental states. We also discussed that there is always a risk not just to him but also people around him. he has been encouraged to call the office with any questions or concerns that should arise related to todays visit.  No orders of the defined types were placed in this encounter.       I have personally obtained a history, examined the patient, evaluated laboratory and imaging results, formulated the assessment and plan and placed orders. This patient was seen today by Lauraine Lay, PA-C in collaboration with Dr. Elfreda Bathe.   Elfreda DELENA Bathe, MD [facility] Diplomate ABMS Pulmonary Critical Care Medicine and Sleep Medicine

## 2024-01-29 ENCOUNTER — Ambulatory Visit (INDEPENDENT_AMBULATORY_CARE_PROVIDER_SITE_OTHER): Admitting: Internal Medicine

## 2024-01-29 VITALS — BP 147/89 | HR 62 | Resp 16 | Ht 74.0 in | Wt 319.3 lb

## 2024-01-29 DIAGNOSIS — G4733 Obstructive sleep apnea (adult) (pediatric): Secondary | ICD-10-CM | POA: Diagnosis not present

## 2024-01-29 DIAGNOSIS — Z7189 Other specified counseling: Secondary | ICD-10-CM

## 2024-01-29 NOTE — Patient Instructions (Signed)
